# Patient Record
Sex: Male | Born: 1980 | Race: Black or African American | Hispanic: No | Marital: Single | Smoking: Current every day smoker
Health system: Southern US, Community
[De-identification: ages and names within clinical notes are randomized; demographics above are authoritative.]

---

## 2019-05-23 ENCOUNTER — Other Ambulatory Visit: Payer: Self-pay

## 2019-05-23 ENCOUNTER — Ambulatory Visit (HOSPITAL_COMMUNITY)
Admission: EM | Admit: 2019-05-23 | Discharge: 2019-05-23 | Disposition: A | Discharge status: Home / Self Care | Payer: Self-pay | Attending: Family Medicine | Admitting: Family Medicine

## 2019-05-23 ENCOUNTER — Encounter (HOSPITAL_COMMUNITY): Payer: Self-pay

## 2019-05-23 DIAGNOSIS — M65831 Other synovitis and tenosynovitis, right forearm: Secondary | ICD-10-CM

## 2019-05-23 MED ORDER — HYDROCODONE-ACETAMINOPHEN 5-325 MG PO TABS: 1.0000 | ORAL_TABLET | ORAL | 0 refills | Status: DC | PRN

## 2019-05-23 MED ORDER — METHYLPREDNISOLONE 4 MG PO TBPK: ORAL_TABLET | ORAL | 0 refills | Status: DC

## 2019-05-23 NOTE — Discharge Instructions (Addendum)
Use ice for 20 minutes every couple of hours Wear brace as long as versus painful Limit heavy use of hand Take Medrol pack as directed.  Take all of day 1 today Take pain medication as needed.  Do not drive on pain medication Expect improvement over few days.  See orthopedist if you fail to improve

## 2019-05-23 NOTE — ED Triage Notes (Signed)
Patient presents to Urgent Care with complaints of a painful "knot" on the top of his right hand since about 5 days ago. Patient reports no known injury, woke up with it.

## 2019-05-23 NOTE — ED Provider Notes (Signed)
[facility]    CSN: [id number] Arrival date & time: 05/23/19  1049      History   Chief Complaint Chief Complaint  Patient presents with  . Arm Pain    HPI Jason Acosta is a 38 y.o. male.   HPI  Patient has swelling and pain on the top of his right hand.  Is been present for 5 days.  It is worse when he uses it better with rest.  He does not recall any trauma or accident.  He works as a Building control surveyor.  He uses his hands extensively in his job.  Does not recall any specific repetitive motion.  He does a lot of grabbing and grasping.  He is right-handed.  No numbness or weakness.  History reviewed. No pertinent past medical history.  There are no active problems to display for this patient.   History reviewed. No pertinent surgical history.     Home Medications    Prior to Admission medications   Medication Sig Start Date End Date Taking? Authorizing Provider  HYDROcodone-acetaminophen (NORCO/VICODIN) 5-325 MG tablet Take 1-2 tablets by mouth every 4 (four) hours as needed. 05/23/19   Raylene Everts, MD  methylPREDNISolone (MEDROL DOSEPAK) 4 MG TBPK tablet TAD 05/23/19   Raylene Everts, MD    Family History Family History  Problem Relation Age of Onset  . Diabetes Mother   . Healthy Father     Social History Social History   Tobacco Use  . Smoking status: Current Every Day Smoker    Packs/day: 0.50    Types: Cigarettes, Cigars  . Smokeless tobacco: Never Used  . Tobacco comment: black and milds  Substance Use Topics  . Alcohol use: Not on file  . Drug use: Not on file     Allergies   Patient has no known allergies.   Review of Systems Review of Systems  Constitutional: Negative for chills and fever.  HENT: Negative for ear pain and sore throat.   Eyes: Negative for pain and visual disturbance.  Respiratory: Negative for cough and shortness of breath.   Cardiovascular: Negative for chest pain and palpitations.  Gastrointestinal:  Negative for abdominal pain and vomiting.  Genitourinary: Negative for dysuria and hematuria.  Musculoskeletal: Positive for arthralgias. Negative for back pain.  Skin: Negative for color change and rash.  Neurological: Negative for seizures and syncope.  All other systems reviewed and are negative.    Physical Exam Triage Vital Signs ED Triage Vitals  Enc Vitals Group     BP 05/23/19 1134 122/70     Pulse Rate 05/23/19 1134 67     Resp 05/23/19 1134 16     Temp 05/23/19 1134 98.8 F (37.1 C)     Temp Source 05/23/19 1134 Oral     SpO2 05/23/19 1134 100 %     Weight --      Height --      Head Circumference --      Peak Flow --      Pain Score 05/23/19 1132 10     Pain Loc --      Pain Edu? --      Excl. in [place]? --    No data found.  Updated Vital Signs BP 122/70 (BP Location: Left Arm)   Pulse 67   Temp 98.8 F (37.1 C) (Oral)   Resp 16   SpO2 100%     Physical Exam Constitutional:      General: He is not  in acute distress.    Appearance: He is well-developed.  HENT:     Head: Normocephalic and atraumatic.  Eyes:     Conjunctiva/sclera: Conjunctivae normal.     Pupils: Pupils are equal, round, and reactive to light.  Neck:     Musculoskeletal: Normal range of motion.  Cardiovascular:     Rate and Rhythm: Normal rate.  Pulmonary:     Effort: Pulmonary effort is normal. No respiratory distress.  Abdominal:     General: There is no distension.     Palpations: Abdomen is soft.  Musculoskeletal: Normal range of motion.     Comments: Right hand has swelling over the dorsum central carpal region.  Is over the extensor tendons of the hand.  Pain with flexion and extension of the fingers.  Pain with range of motion of the hand.  Patient especially has pain with flexion of the fingers and wrist concomitantly  Skin:    General: Skin is warm and dry.  Neurological:     General: No focal deficit present.     Mental Status: He is alert.     Sensory: No sensory  deficit.     Motor: No weakness.     Coordination: Coordination normal.     Deep Tendon Reflexes: Reflexes normal.  Psychiatric:        Mood and Affect: Mood normal.        Behavior: Behavior normal.      UC Treatments / Results  Labs (all labs ordered are listed, but only abnormal results are displayed) Labs Reviewed - No data to display  EKG   Radiology No results found.  Procedures Procedures (including critical care time)  Medications Ordered in UC Medications - No data to display  Initial Impression / Assessment and Plan / UC Course  I have reviewed the triage vital signs and the nursing notes.  Pertinent labs & imaging results that were available during my care of the patient were reviewed by me and considered in my medical decision making (see chart for details).     Extensor tenosynovitis is discussed with patient. Final Clinical Impressions(s) / UC Diagnoses   Final diagnoses:  Extensor tenosynovitis of wrist, right     Discharge Instructions     Use ice for 20 minutes every couple of hours Wear brace as long as versus painful Limit heavy use of hand Take Medrol pack as directed.  Take all of day 1 today Take pain medication as needed.  Do not drive on pain medication Expect improvement over few days.  See orthopedist if you fail to improve   ED Prescriptions    Medication Sig Dispense Auth. Provider   methylPREDNISolone (MEDROL DOSEPAK) 4 MG TBPK tablet TAD 21 tablet Eustace Moore, MD   HYDROcodone-acetaminophen (NORCO/VICODIN) 5-325 MG tablet Take 1-2 tablets by mouth every 4 (four) hours as needed. 10 tablet Eustace Moore, MD     I have reviewed the PDMP during this encounter.   Eustace Moore, MD 05/23/19 [phone]

## 2019-06-15 ENCOUNTER — Other Ambulatory Visit: Payer: Self-pay

## 2019-06-15 ENCOUNTER — Encounter (HOSPITAL_COMMUNITY): Payer: Self-pay

## 2019-06-15 ENCOUNTER — Ambulatory Visit (HOSPITAL_COMMUNITY)
Admission: EM | Admit: 2019-06-15 | Discharge: 2019-06-15 | Disposition: A | Discharge status: Home / Self Care | Payer: Self-pay | Attending: Urgent Care | Admitting: Urgent Care

## 2019-06-15 DIAGNOSIS — M25431 Effusion, right wrist: Secondary | ICD-10-CM

## 2019-06-15 DIAGNOSIS — M25531 Pain in right wrist: Secondary | ICD-10-CM

## 2019-06-15 DIAGNOSIS — M65831 Other synovitis and tenosynovitis, right forearm: Secondary | ICD-10-CM

## 2019-06-15 MED ORDER — PREDNISONE 20 MG PO TABS: ORAL_TABLET | ORAL | 0 refills | Status: DC

## 2019-06-15 MED ORDER — NAPROXEN 500 MG PO TABS: 500.0000 mg | ORAL_TABLET | Freq: Two times a day (BID) | ORAL | 0 refills | Status: DC

## 2019-06-15 NOTE — ED Provider Notes (Signed)
  MRN: [id number] DOB: 03-19-1981  Subjective:   Jason Acosta is a 38 y.o. male presenting for 1 month history of persistent right wrist pain and swelling.  Patient was last seen about a month ago, was given a wrist brace.  Unfortunately, patient did not pick up any medications prescribed to him due to concern of taking pain medicines.  This includes him not taking the Medrol pack.  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  Denies fall, trauma, redness, history of gout, history of ganglion cyst.  He is not currently taking any medications and has no known food or drug allergies.  Denies past medical and surgical history.   Family History  Problem Relation Age of Onset  . Diabetes Mother   . Healthy Father     Social History   Tobacco Use  . Smoking status: Current Every Day Smoker    Packs/day: 0.50    Types: Cigarettes, Cigars  . Smokeless tobacco: Never Used  . Tobacco comment: black and milds  Substance Use Topics  . Alcohol use: Not on file  . Drug use: Not on file    ROS   Objective:   Vitals: BP (!) 125/57 (BP Location: Right Arm)   Pulse 74   Temp 98.8 F (37.1 C) (Oral)   Resp 16   SpO2 100%   Physical Exam Constitutional:      Appearance: Normal appearance. He is well-developed and normal weight.  HENT:     Head: Normocephalic and atraumatic.     Right Ear: External ear normal.     Left Ear: External ear normal.     Nose: Nose normal.     Mouth/Throat:     Pharynx: Oropharynx is clear.  Eyes:     Extraocular Movements: Extraocular movements intact.     Pupils: Pupils are equal, round, and reactive to light.  Cardiovascular:     Rate and Rhythm: Normal rate.  Pulmonary:     Effort: Pulmonary effort is normal.  Musculoskeletal:     Right wrist: He exhibits decreased range of motion (extension), tenderness (dorsal aspect of wrist as depicted) and swelling. He exhibits no bony tenderness, no  effusion, no crepitus, no deformity and no laceration.       Arms:  Neurological:     Mental Status: He is alert and oriented to person, place, and time.  Psychiatric:        Mood and Affect: Mood normal.        Behavior: Behavior normal.     Assessment and Plan :   1. Extensor tenosynovitis of right wrist   2. Pain and swelling of right wrist     We will manage conservatively for extensor tenosynovitis due to the nature of his work with prednisone course followed by Naprosyn, rest and modification of physical activity with work restrictions.  Anticipatory guidance provided.  Recommended patient look for PCP through [facility] in case his employer requires FMLA.  Also provided patient with information to [facility] in [place] in case patient needs further work-up including imaging such as an ultrasound or MRI.  Offered patient an x-ray but he refuses.  Counseled patient on potential for adverse effects with medications prescribed/recommended today, ER and return-to-clinic precautions discussed, patient verbalized understanding.    Jaynee Eagles, [place] 06/15/19 [phone]

## 2019-06-15 NOTE — ED Triage Notes (Signed)
Pt presents to UC w/ c/o right wrist pain x1 month. Pt states he was seen here 1 month ago and given a brace. Pt states pain has not gotten better since last visit. Pt has small swollen painful area on right wrist.

## 2019-06-15 NOTE — Discharge Instructions (Signed)
Wear the Ace wrap while at work. If needed, wear it at bedtime. Going forward, after each shift, apply ice to your right wrist for 20 minutes.

## 2019-06-21 ENCOUNTER — Other Ambulatory Visit: Payer: Self-pay

## 2019-06-21 ENCOUNTER — Emergency Department (HOSPITAL_COMMUNITY)
Admission: EM | Admit: 2019-06-21 | Discharge: 2019-06-21 | Disposition: A | Discharge status: Home / Self Care | Payer: Self-pay | Attending: Emergency Medicine | Admitting: Emergency Medicine

## 2019-06-21 ENCOUNTER — Encounter (HOSPITAL_COMMUNITY): Payer: Self-pay

## 2019-06-21 ENCOUNTER — Emergency Department (HOSPITAL_COMMUNITY): Payer: Self-pay

## 2019-06-21 DIAGNOSIS — F17210 Nicotine dependence, cigarettes, uncomplicated: Secondary | ICD-10-CM | POA: Insufficient documentation

## 2019-06-21 DIAGNOSIS — R1032 Left lower quadrant pain: Secondary | ICD-10-CM | POA: Insufficient documentation

## 2019-06-21 DIAGNOSIS — Z5320 Procedure and treatment not carried out because of patient's decision for unspecified reasons: Secondary | ICD-10-CM | POA: Insufficient documentation

## 2019-06-21 DIAGNOSIS — Z5321 Procedure and treatment not carried out due to patient leaving prior to being seen by health care provider: Secondary | ICD-10-CM

## 2019-06-21 DIAGNOSIS — M79675 Pain in left toe(s): Secondary | ICD-10-CM | POA: Insufficient documentation

## 2019-06-21 MED ORDER — ACETAMINOPHEN 325 MG PO TABS
650.0000 mg | ORAL_TABLET | Freq: Once | ORAL | Status: AC
  Administered 2019-06-21: 650 mg via ORAL
  Filled 2019-06-21: qty 2

## 2019-06-21 NOTE — ED Notes (Signed)
Pt seen walking out of ED without any problems before being discharged

## 2019-06-21 NOTE — ED Provider Notes (Signed)
[facility] Provider Note   CSN: [id number] Arrival date & time: 06/21/19  1556     History   Chief Complaint Chief Complaint  Patient presents with   Toe Pain   Groin Pain    HPI Jason Acosta is a 38 y.o. male with no known past medical history presenting to emergency department today with chief complaint of left great toe pain and groin pain.  He endorses toe pain x 5 days. He denies any injury. Pain has progressively worsened since onset. He describes pain as sharp. Pain does not radiate. He rates pain 7/10 in severity. Pain is worse with movement. He took aspirin with minimal pain relief.  Also noticed knot in left groin this morning. He states when getting dressed this morning he felt pain in his left thigh/groin area. He describes pain as intermittent aching. He rates pain 3/10 in severity. No radiating or modifying factors. No history of similar pain.  Pain denies history of gout or diabetes. He denies fever, chills, chest pain, rash, numbness, weakness, nausea, vomiting, urinary symptoms, penile discharge. He is not concerned for STIs. History reviewed. No pertinent past medical history.  There are no active problems to display for this patient.   History reviewed. No pertinent surgical history.      Home Medications    Prior to Admission medications   Medication Sig Start Date End Date Taking? Authorizing Provider  naproxen (NAPROSYN) 500 MG tablet Take 1 tablet (500 mg total) by mouth 2 (two) times daily. 06/15/19   Jaynee Eagles, PA-C  predniSONE (DELTASONE) 20 MG tablet Take 2 tablets daily with breakfast. 06/15/19   Jaynee Eagles, PA-C    Family History Family History  Problem Relation Age of Onset   Diabetes Mother    Healthy Father     Social History Social History   Tobacco Use   Smoking status: Current Every Day Smoker    Packs/day: 0.50    Types: Cigarettes, Cigars   Smokeless tobacco: Never Used    Tobacco comment: black and milds  Substance Use Topics   Alcohol use: Not on file   Drug use: Not on file     Allergies   Patient has no known allergies.   Review of Systems Review of Systems  Constitutional: Negative for chills, fatigue, fever and unexpected weight change.  Gastrointestinal: Negative for abdominal pain, nausea and vomiting.  Genitourinary: Negative for discharge, dysuria, frequency, genital sores, penile pain, penile swelling, scrotal swelling and testicular pain.  Musculoskeletal: Positive for arthralgias. Negative for back pain, gait problem, myalgias and neck pain.  Skin: Negative for rash and wound.     Physical Exam Updated Vital Signs BP 124/70    Pulse 77    Temp 98.8 F (37.1 C)    Resp 16    SpO2 100%   Physical Exam Vitals signs and nursing note reviewed.  Constitutional:      Appearance: He is well-developed. He is not ill-appearing or toxic-appearing.  HENT:     Head: Normocephalic and atraumatic.     Nose: Nose normal.  Eyes:     General: No scleral icterus.       Right eye: No discharge.        Left eye: No discharge.     Conjunctiva/sclera: Conjunctivae normal.  Neck:     Musculoskeletal: Normal range of motion.     Vascular: No JVD.  Cardiovascular:     Rate and Rhythm: Normal rate and regular  rhythm.     Pulses: Normal pulses.     Heart sounds: Normal heart sounds.  Pulmonary:     Effort: Pulmonary effort is normal.     Breath sounds: Normal breath sounds.  Abdominal:     General: There is no distension.  Genitourinary:      Comments: Tenderness to palpation as depicted in image above. No gross abscess, no induration or fluctuance. No erythema or edema noted. No wounds seen.  Chaperone [employer] [facility] [employer] RN present for exam. No discharge or urethritis noted. No signs of sores or lesions or erythema on the penis or testicles. The penis and testicles are nontender. No testicular masses or swelling. No scrotal swelling. No signs of any  inguinal hernias. Cremaster reflex present bilaterally.   Musculoskeletal: Normal range of motion.       Feet:     Comments: Full ROM of left ankle. Pt ambulates with mildly antalgic gait  Feet:     Right foot:     Skin integrity: Dry skin present.     Toenail Condition: Right toenails are normal.     Left foot:     Skin integrity: Dry skin present.     Toenail Condition: Left toenails are normal.     Comments: Tenderness to palpation as depicted in image above. No open wound. Warm to palpation without overlying erythema. No induration or fluctuance. No gross abscess or signs of infection.   Able to wiggle all toes. Brisk cap refill. DP pulse 2+ Skin:    General: Skin is warm and dry.  Neurological:     Mental Status: He is oriented to person, place, and time.     GCS: GCS eye subscore is 4. GCS verbal subscore is 5. GCS motor subscore is 6.     Comments: Fluent speech, no facial droop.  Psychiatric:        Behavior: Behavior normal.      ED Treatments / Results  Labs (all labs ordered are listed, but only abnormal results are displayed) Labs Reviewed - No data to display  EKG None  Radiology Dg Toe Great Left  Result Date: 06/21/2019 CLINICAL DATA:  Left great toe pain for 5 days, pain worsened medially EXAM: LEFT GREAT TOE COMPARISON:  None. FINDINGS: There is some mild soft tissue thickening along the medial aspect of the first interphalangeal and metacarpophalangeal joints. No significant underlying arthrosis, erosion or destructive changes seen. Bone mineralization is appropriate. No acute fracture or traumatic malalignment. IMPRESSION: Mild nonspecific soft tissue thickening along the medial aspect of the first interphalangeal and metacarpophalangeal joints without underlying osseous abnormality or focal erosion. Electronically Signed   By: Kreg ShropshirePrice  DeHay M.D.   On: 06/21/2019 17:03    Procedures Procedures (including critical care time)  Medications Ordered in  ED Medications  acetaminophen (TYLENOL) tablet 650 mg (650 mg Oral Given 06/21/19 2059)     Initial Impression / Assessment and Plan / ED Course  I have reviewed the triage vital signs and the nursing notes.  Pertinent labs & imaging results that were available during my care of the patient were reviewed by me and considered in my medical decision making (see chart for details).  Patient seen and examined. Patient nontoxic appearing, in no apparent distress. He is afebrile, vitals stable. He presents with toe pain. Xray ordered in triage. I viewed image which shows mild nonspecific soft tissue thickening along the medial aspect of the first interphalangeal and metacarpophalangeal joints without underlying osseous abnormality or focal erosion.  No signs of osteomyelitis. On exam he has no gross abscess or wound. No signs of ingrown toenail. Not overly suggestive of gout, he is pescatarian and does not drink alcohol.   He also has the pain his left groin. No inguinal lymphadenopathy felt.  No signs of inguinal hernia. Again no abscess noted. Pt not concerned for STIs. No GU finding on exam. Unlikely gonorrhea. No rash or findings to suggest syphilis.   Patient eloped from emergency department. I was informed of his departure by nursing staff after he left the department. RN comments he ambulated out of ED with steady gait, no signs of distress.  Portions of this note were generated with Scientist, clinical (histocompatibility and immunogenetics). Dictation errors may occur despite best attempts at proofreading.    Final Clinical Impressions(s) / ED Diagnoses   Final diagnoses:  Eloped from emergency department    ED Discharge Orders    None       Sherene Sires, PA-C 06/22/19 0043    Milagros Loll, MD 06/30/19 2055

## 2019-06-21 NOTE — ED Triage Notes (Signed)
Pt reports left big toe pain for 5 days, and also states he noticed a knot in his left groin area this morning. Pt ambulatory

## 2019-06-22 ENCOUNTER — Emergency Department (HOSPITAL_COMMUNITY)
Admission: EM | Admit: 2019-06-22 | Discharge: 2019-06-23 | Disposition: A | Discharge status: Home / Self Care | Payer: Self-pay | Attending: Emergency Medicine | Admitting: Emergency Medicine

## 2019-06-22 ENCOUNTER — Encounter (HOSPITAL_COMMUNITY): Payer: Self-pay | Admitting: *Deleted

## 2019-06-22 ENCOUNTER — Other Ambulatory Visit: Payer: Self-pay

## 2019-06-22 DIAGNOSIS — M79672 Pain in left foot: Secondary | ICD-10-CM | POA: Insufficient documentation

## 2019-06-22 DIAGNOSIS — R2242 Localized swelling, mass and lump, left lower limb: Secondary | ICD-10-CM | POA: Insufficient documentation

## 2019-06-22 DIAGNOSIS — F17210 Nicotine dependence, cigarettes, uncomplicated: Secondary | ICD-10-CM | POA: Insufficient documentation

## 2019-06-22 DIAGNOSIS — R1032 Left lower quadrant pain: Secondary | ICD-10-CM | POA: Insufficient documentation

## 2019-06-22 DIAGNOSIS — M79675 Pain in left toe(s): Secondary | ICD-10-CM

## 2019-06-22 MED ORDER — DOXYCYCLINE HYCLATE 100 MG PO CAPS: 100.0000 mg | ORAL_CAPSULE | Freq: Two times a day (BID) | ORAL | 0 refills | Status: AC

## 2019-06-22 MED ORDER — OXYCODONE-ACETAMINOPHEN 5-325 MG PO TABS: 1.0000 | ORAL_TABLET | Freq: Three times a day (TID) | ORAL | 0 refills | Status: DC | PRN

## 2019-06-22 MED ORDER — DOXYCYCLINE HYCLATE 100 MG PO TABS
100.0000 mg | ORAL_TABLET | Freq: Once | ORAL | Status: AC
  Administered 2019-06-23: 100 mg via ORAL
  Filled 2019-06-22: qty 1

## 2019-06-22 MED ORDER — IBUPROFEN 400 MG PO TABS
600.0000 mg | ORAL_TABLET | Freq: Once | ORAL | Status: AC
  Administered 2019-06-23: 600 mg via ORAL
  Filled 2019-06-22: qty 1

## 2019-06-22 NOTE — ED Triage Notes (Signed)
The pt is c/o pain in his lt entire leg  For 3-4 days  He was here yesterday  But got tired of waiting and left   No known injury

## 2019-06-22 NOTE — ED Provider Notes (Signed)
[facility] EMERGENCY DEPARTMENT Provider Note   CSN: [id number] Arrival date & time: 06/22/19  2110     History   Chief Complaint Chief Complaint  Patient presents with  . Leg Pain    HPI Jason Acosta is a 38 y.o. male with no significant past medical history who presents to the emergency department with a chief complaint of left toe pain.  The patient reports left toe pain that has been constant and worsening since onset for the last 3-4 days.  He reports that the pain is now radiating up the foot and into his leg.  Pain is worse with nonweightbearing and worse with weightbearing and walking.  No known injury to the toe or foot.  No recent surgery.  He took 1000 g of Tylenol earlier this evening with no improvement in his symptoms.  He also notes that he developed left-sided groin pain 3-4 days ago around when the toe pain began.  He reports that the area is erythematous and appears swollen.  No areas of drainage.  No red streaking.  No fevers or chills.  He denies difficulty urinating, penile or testicular pain or swelling, nausea, vomiting, diarrhea, shortness of breath, chest pain, numbness, or weakness.  No left hip or knee pain.  Of note, the patient was also seen by urgent care around a month ago for swelling to the dorsum of the right hand and wrist.  He was discharged with pain medication and a prednisone Dosepak with no improvement in his symptoms.  He followed up with urgent care again last week and was referred to orthopedics, but has not made a follow-up appointment because he does not currently have medical insurance.  The patient reports that he lives in [place], but spends most of his time in [place] because his job is located here.  He does not have a primary care provider.     The history is provided by the patient. No language interpreter was used.    History reviewed. No pertinent past medical history.  There are no active problems to  display for this patient.   History reviewed. No pertinent surgical history.      Home Medications    Prior to Admission medications   Medication Sig Start Date End Date Taking? Authorizing Provider  doxycycline (VIBRAMYCIN) 100 MG capsule Take 1 capsule (100 mg total) by mouth 2 (two) times daily for 7 days. 06/22/19 06/29/19  Jevaughn Degollado A, PA-C  naproxen (NAPROSYN) 500 MG tablet Take 1 tablet (500 mg total) by mouth 2 (two) times daily. 06/15/19   Jaynee Eagles, PA-C  oxyCODONE-acetaminophen (PERCOCET/ROXICET) 5-325 MG tablet Take 1 tablet by mouth every 8 (eight) hours as needed for severe pain. 06/22/19   Krysta Bloomfield A, PA-C  predniSONE (DELTASONE) 20 MG tablet Take 2 tablets daily with breakfast. 06/15/19   Jaynee Eagles, PA-C    Family History Family History  Problem Relation Age of Onset  . Diabetes Mother   . Healthy Father     Social History Social History   Tobacco Use  . Smoking status: Current Every Day Smoker    Packs/day: 0.50    Types: Cigarettes, Cigars  . Smokeless tobacco: Never Used  . Tobacco comment: black and milds  Substance Use Topics  . Alcohol use: Never    Frequency: Never  . Drug use: Never     Allergies   Patient has no known allergies.   Review of Systems Review of Systems  Constitutional: Negative  for appetite change, chills and fever.  HENT: Negative for congestion and sore throat.   Respiratory: Negative for shortness of breath.   Cardiovascular: Negative for chest pain.  Gastrointestinal: Negative for abdominal pain, diarrhea, nausea and vomiting.  Genitourinary: Negative for discharge, dysuria, flank pain, frequency, genital sores, penile pain, penile swelling, scrotal swelling, testicular pain and urgency.  Musculoskeletal: Positive for arthralgias, gait problem and myalgias. Negative for back pain, joint swelling, neck pain and neck stiffness.  Skin: Positive for color change. Negative for rash.  Allergic/Immunologic:  Negative for immunocompromised state.  Neurological: Negative for syncope, weakness, numbness and headaches.  Psychiatric/Behavioral: Negative for confusion.    Physical Exam Updated Vital Signs BP 126/74 (BP Location: Right Arm)   Pulse (!) 50   Temp 97.7 F (36.5 C) (Oral)   Resp 16   Ht 5\' 8"  (1.727 m)   Wt 63.5 kg   SpO2 99%   BMI 21.29 kg/m   Physical Exam Vitals signs and nursing note reviewed.  Constitutional:      General: He is not in acute distress.    Appearance: He is well-developed. He is not ill-appearing, toxic-appearing or diaphoretic.  HENT:     Head: Normocephalic.  Eyes:     Conjunctiva/sclera: Conjunctivae normal.  Neck:     Musculoskeletal: Neck supple.  Cardiovascular:     Rate and Rhythm: Normal rate and regular rhythm.     Heart sounds: No murmur.  Pulmonary:     Effort: Pulmonary effort is normal. No respiratory distress.     Breath sounds: No stridor. No wheezing, rhonchi or rales.  Chest:     Chest wall: No tenderness.  Abdominal:     General: There is no distension.     Palpations: Abdomen is soft. There is no mass.     Tenderness: There is no abdominal tenderness. There is no right CVA tenderness, left CVA tenderness, guarding or rebound.     Hernia: No hernia is present.  Musculoskeletal:     Right lower leg: No edema.     Left lower leg: No edema.     Comments: Area of redness noted in the femoral region of the left side with a localized area of tenderness.  There appears to be a large, swollen, circular located in this area.  No fluctuance or induration.  No other palpable lymph nodes in the groin bilaterally.  No other swelling or redness located throughout the thigh or in the groin.  Chaperoned exam and no testicular pain or swelling.  Left great toe is diffusely swollen and diffusely tender to palpation.  At least 1/3 larger in size when compared to the right great toe. Unable to flex or extend at the IP joint of the left great toe  secondary to pain.  No focal tenderness to palpation over the left MTP joint.  There is no redness or warmth throughout the entire toe.  The pad of the left great toe is soft.  There is no evidence of purulence around the lateral nail folds or epionychium.   Swelling is noted to the dorsum of the right wrist.  Minimally tender to palpation.  Full active and passive range of motion of the right wrist.  Neurovascularly intact to the bilateral upper and lower extremities.  Skin:    General: Skin is warm and dry.  Neurological:     Mental Status: He is alert.  Psychiatric:        Behavior: Behavior normal.  ED Treatments / Results  Labs (all labs ordered are listed, but only abnormal results are displayed) Labs Reviewed - No data to display  EKG None  Radiology Dg Toe Great Left  Result Date: 06/21/2019 CLINICAL DATA:  Left great toe pain for 5 days, pain worsened medially EXAM: LEFT GREAT TOE COMPARISON:  None. FINDINGS: There is some mild soft tissue thickening along the medial aspect of the first interphalangeal and metacarpophalangeal joints. No significant underlying arthrosis, erosion or destructive changes seen. Bone mineralization is appropriate. No acute fracture or traumatic malalignment. IMPRESSION: Mild nonspecific soft tissue thickening along the medial aspect of the first interphalangeal and metacarpophalangeal joints without underlying osseous abnormality or focal erosion. Electronically Signed   By: Kreg ShropshirePrice  DeHay M.D.   On: 06/21/2019 17:03    Procedures Ultrasound ED Soft Tissue  Date/Time: 06/22/2019 11:58 PM Performed by: Barkley BoardsMcDonald, Devine Klingel A, PA-C Authorized by: Barkley BoardsMcDonald, Akaisha Truman A, PA-C   Procedure details:    Indications: limb pain     Transverse view:  Visualized   Longitudinal view:  Visualized   Images: archived   Location:    Location: lower extremity     Side:  Left Findings:     no abscess present    no cellulitis present Comments:     There is a large  circular area concerning for a swollen lymph node.  This does not look consistent with an abscess and there is no evidence of cellulitis.  However, this area is located adjacent to the left femoral vein, which was not able to fully be compressed on bedside ultrasound, but there does appear to be flow.  Exam is limited secondary to tenderness in this area.   (including critical care time)  Medications Ordered in ED Medications  ibuprofen (ADVIL) tablet 600 mg (600 mg Oral Given 06/23/19 0016)  doxycycline (VIBRA-TABS) tablet 100 mg (100 mg Oral Given 06/23/19 0016)     Initial Impression / Assessment and Plan / ED Course  I have reviewed the triage vital signs and the nursing notes.  Pertinent labs & imaging results that were available during my care of the patient were reviewed by me and considered in my medical decision making (see chart for details).  Clinical Course as of Jun 22 652  Wed Jun 22, 2019  [id number] 38 year old male with some great toe swelling pain over the last few days now also with pain mostly in his inner thigh.  There is a little bit of overlying erythema and he is got some tender nodules.  We took a look with the ultrasound and I do not see any gross evidence of DVT but he has so much tenderness in that area that is hard to fully compress some.  There was some augmentation to compression of his calf so I think DVT is less likely.  Formal duplex not available at this time so we will treat him for possible infection in the foot with a groin adenitis and have him come back tomorrow to get a formal ultrasound.   [MB]    Clinical Course User Index [MB] Terrilee FilesButler, Michael C, MD       38 year old male with no significant past medical history presenting with left groin pain and left great toe pain that began approximately 3 to 4 days ago.  No constitutional symptoms.  He is hemodynamically stable on arrival to the ER.  The patient was seen and evaluated alongside Dr. Charm BargesButler,  attending physician.  Patient's left great toe is edematous  and diffusely painful to palpation.  He has no tenderness over the MTP joint.  There is no redness or warmth.  X-ray from previous ER visit was reviewed, which demonstrated mild nonspecific soft tissue thickening along the medial aspect of the first interphalangeal and metacarpophalangeal joints without underlying osseous abnormality or focal erosion.  This digit is swollen to at least at least a third of the size when compared to the right great toe.   In regards to his groin pain, there is no obvious abscess or cellulitis on bedside ultrasound.  Also considered femoral hernia, but this is less likely. Bedside ultrasound appears to show an enlarged lymph node, but he is very tender in this area and there is a portion of the femoral vein that is difficult to compress.  This could represent a acutely enlarged lymph node secondary to the possible infection in his left great toe.  However, could also represent a proximal DVT.  He is having no GU complaints at this time.  Low suspicion for scrotal abscess, epididymitis, testicular torsion, or other GU pathology.  Will schedule the patient for an outpatient vascular venous duplex study.  We will also start the patient on doxycycline cellulitic versus indolent infection of the left great toe.  He will be given a referral to vascular study.  The patient is also not established with primary care and additionally had a tender, painful mask appear on the dorsum of the right hand and wrist approximately 1 to 1-1/2 months ago that has not resolved and he is unable to fully flex and extend the wrist.  Care management was consulted to assist the patient with getting established with primary care.  I will also send the patient home with a short course of pain medication.  Final Clinical Impressions(s) / ED Diagnoses   Final diagnoses:  Pain and swelling of toe of left foot  Groin pain, left    ED Discharge  Orders         Ordered    LE VENOUS     06/22/19 2327    doxycycline (VIBRAMYCIN) 100 MG capsule  2 times daily     06/22/19 2329    oxyCODONE-acetaminophen (PERCOCET/ROXICET) 5-325 MG tablet  Every 8 hours PRN     06/22/19 2329           Tinzlee Craker, Pedro Earls A, PA-C 06/23/19 0653    Terrilee Files, MD 06/23/19 [phone]

## 2019-06-22 NOTE — Discharge Instructions (Signed)
Thank you for allowing me to care for you today in the Emergency Department.   IMPORTANT PATIENT INSTRUCTIONS:   You have been scheduled for an Outpatient Vascular Study at [facility].    If tomorrow is a Saturday or Sunday, please go to the [facility] Emergency Department registration desk at 8 AM tomorrow morning and tell them you are therefore a vascular study.  If tomorrow is a weekday (Monday - Friday), please go to the [facility] Admitting Department at 8 AM and tell them you are therefore a vascular study   For the pain and swelling in your toe, your first dose of antibiotics, doxycycline, was given tonight in the ER.  Starting tomorrow morning, take 1 tablet of doxycycline 2 times daily for the next week.  Take 650 mg of Tylenol or 600 mg of ibuprofen with food every 6 hours for pain.  You can alternate between these 2 medications every 3 hours if your pain returns.  For instance, you can take Tylenol at noon, followed by a dose of ibuprofen at 3, followed by second dose of Tylenol and 6.  For severe, uncontrollable pain, you can take 1 tablet of Percocet.  Each tablet of Percocet contains 325 mg of Tylenol.  Do not take more than 4000 mg of Tylenol in a 24-hour period.  Percocet is a narcotic.  It can be addicting.  You should not work or drive or take this medication with other sedating substances, such as alcohol.  You can also apply an ice pack to your toe for 15 to 20 minutes as frequently as needed to help with pain and swelling.  Try to elevate your left leg so that your toes are at or above the level of your nose.  Call to schedule a follow-up appointment with Dr. Daylene Katayama with podiatry to follow-up within the next week regarding your left toe pain and swelling, especially if your symptoms do not significantly improve on antibiotics.  Please also see the information above from [facility] health and wellness regarding getting set up with a primary care provider for follow-up if  you continue to have other health concerns.  You should return to the emergency department if you develop chest pain, shortness of breath, fevers, red streaking up the foot or the toe, if you start to have thick, mucus-like drainage from the toe, if you become unable to urinate, or develop new weakness or numbness in your leg or if it becomes cold to the touch, or other new, concerning symptoms.

## 2019-06-23 ENCOUNTER — Other Ambulatory Visit: Payer: Self-pay

## 2019-06-23 ENCOUNTER — Ambulatory Visit (HOSPITAL_COMMUNITY)
Admission: RE | Admit: 2019-06-23 | Discharge: 2019-06-23 | Disposition: A | Discharge status: Home / Self Care | Payer: Self-pay | Source: Ambulatory Visit | Attending: Emergency Medicine | Admitting: Emergency Medicine

## 2019-06-23 DIAGNOSIS — M7989 Other specified soft tissue disorders: Secondary | ICD-10-CM

## 2019-06-23 DIAGNOSIS — L089 Local infection of the skin and subcutaneous tissue, unspecified: Secondary | ICD-10-CM | POA: Insufficient documentation

## 2019-06-23 DIAGNOSIS — R52 Pain, unspecified: Secondary | ICD-10-CM

## 2019-06-23 DIAGNOSIS — R1909 Other intra-abdominal and pelvic swelling, mass and lump: Secondary | ICD-10-CM | POA: Insufficient documentation

## 2019-06-23 NOTE — Progress Notes (Signed)
LLE venous duplex       has been completed. Preliminary results can be found under CV proc through chart review. June Leap, BS, RDMS, RVT    Patient was to misinformed for the arrival time for this test. The correct time of 11am is on the discharge papers.

## 2019-07-14 ENCOUNTER — Other Ambulatory Visit: Payer: Self-pay

## 2019-07-14 ENCOUNTER — Emergency Department (HOSPITAL_COMMUNITY)
Admission: EM | Admit: 2019-07-14 | Discharge: 2019-07-14 | Disposition: A | Discharge status: Home / Self Care | Payer: Self-pay | Attending: Emergency Medicine | Admitting: Emergency Medicine

## 2019-07-14 ENCOUNTER — Encounter (HOSPITAL_COMMUNITY): Payer: Self-pay | Admitting: Emergency Medicine

## 2019-07-14 DIAGNOSIS — M25531 Pain in right wrist: Secondary | ICD-10-CM | POA: Insufficient documentation

## 2019-07-14 DIAGNOSIS — S90222D Contusion of left lesser toe(s) with damage to nail, subsequent encounter: Secondary | ICD-10-CM | POA: Insufficient documentation

## 2019-07-14 DIAGNOSIS — X58XXXD Exposure to other specified factors, subsequent encounter: Secondary | ICD-10-CM | POA: Insufficient documentation

## 2019-07-14 DIAGNOSIS — F17210 Nicotine dependence, cigarettes, uncomplicated: Secondary | ICD-10-CM | POA: Insufficient documentation

## 2019-07-14 MED ORDER — NAPROXEN 375 MG PO TABS: 375.0000 mg | ORAL_TABLET | Freq: Two times a day (BID) | ORAL | 0 refills | Status: DC

## 2019-07-14 NOTE — ED Notes (Signed)
Patient verbalizes understanding of discharge instructions. Opportunity for questioning and answers were provided. Armband removed by staff, pt discharged from ED.  

## 2019-07-14 NOTE — ED Provider Notes (Signed)
[facility] EMERGENCY DEPARTMENT Provider Note   CSN: [id number] Arrival date & time: 07/14/19  3662     History Chief Complaint  Patient presents with  . Wrist Pain    Jason Acosta is a 38 y.o. male presents today for right wrist pain.  Patient reports pain ongoing for approximately 1 month.  He describes a throbbing sensation to the dorsum of his right wrist constant moderate intensity worsened with movement, nonradiating.  Slightly improved with oxycodone given during a prior visit.  He reports x-rays were performed at this facility of the right wrist during a previous visit and he reports they were "negative".  Unable to find these images and chart review.  Additionally patient requests recheck of his left great toe.  He reports he was seen at this ED last month for pain and swelling and was prescribed an antibiotic, symptoms improved but he is concerned that his toenail may be falling off.  No fever/chills, headache, chest pain, shortness of breath, abdominal pain, nausea vomiting, numbness/tingling, weakness, color change or any additional concerns.  HPI     History reviewed. No pertinent past medical history.  There are no problems to display for this patient.   History reviewed. No pertinent surgical history.     Family History  Problem Relation Age of Onset  . Diabetes Mother   . Healthy Father     Social History   Tobacco Use  . Smoking status: Current Every Day Smoker    Packs/day: 0.50    Types: Cigarettes, Cigars  . Smokeless tobacco: Never Used  . Tobacco comment: black and milds  Substance Use Topics  . Alcohol use: Never  . Drug use: Never    Home Medications Prior to Admission medications   Medication Sig Start Date End Date Taking? Authorizing Provider  naproxen (NAPROSYN) 375 MG tablet Take 1 tablet (375 mg total) by mouth 2 (two) times daily. 07/14/19   Bill Salinas, PA-C  oxyCODONE-acetaminophen (PERCOCET/ROXICET)  5-325 MG tablet Take 1 tablet by mouth every 8 (eight) hours as needed for severe pain. 06/22/19   McDonald, Mia A, PA-C  predniSONE (DELTASONE) 20 MG tablet Take 2 tablets daily with breakfast. 06/15/19   Wallis Bamberg, PA-C    Allergies    Patient has no known allergies.  Review of Systems   Review of Systems Ten systems are reviewed and are negative for acute change except as noted in the HPI  Physical Exam Updated Vital Signs BP 114/63 (BP Location: Left Arm)   Pulse 68   Temp 98.3 F (36.8 C) (Oral)   Resp 14   Ht 5\' 8"  (1.727 m)   Wt 63.5 kg   SpO2 98%   BMI 21.29 kg/m   Physical Exam Constitutional:      General: He is not in acute distress.    Appearance: Normal appearance. He is well-developed. He is not ill-appearing or diaphoretic.  HENT:     Head: Normocephalic and atraumatic.     Right Ear: External ear normal.     Left Ear: External ear normal.     Nose: Nose normal.  Eyes:     General: Vision grossly intact. Gaze aligned appropriately.     Pupils: Pupils are equal, round, and reactive to light.  Neck:     Trachea: Trachea and phonation normal. No tracheal deviation.  Cardiovascular:     Rate and Rhythm: Normal rate and regular rhythm.     Pulses:  Radial pulses are 2+ on the right side and 2+ on the left side.       Dorsalis pedis pulses are 2+ on the left side.  Pulmonary:     Effort: Pulmonary effort is normal. No respiratory distress.  Abdominal:     General: There is no distension.     Palpations: Abdomen is soft.     Tenderness: There is no abdominal tenderness. There is no guarding or rebound.  Musculoskeletal:        General: Normal range of motion.     Right elbow: Normal.     Right forearm: Normal.     Left forearm: Normal.     Right wrist: Tenderness present. No deformity. Normal range of motion.     Left wrist: Normal.     Right hand: Normal.     Left hand: Normal.       Arms:     Cervical back: Normal range of motion.      Left foot: Normal.       Feet:     Comments: Slight discoloration of the left great toenail, yellow in color.  No surrounding infection. - Right hand: No gross deformities, skin intact. Fingers appear normal.  Tenderness to palpation over dorsum of right wrist and snuffbox. No tenderness to palpation over flexor sheath.  Finger adduction/abduction intact with 5/5 strength.  Thumb opposition intact. Full active and resisted ROM to flexion/extension at wrist, MCP, PIP and DIP of all fingers increased pain with wrist extension and radial deviation.  FDS/FDP intact. Grip 5/5 strength.  Radial artery 2+ with <2sec cap refill in all fingers.  Sensation intact to light-tough in median/ulnar/radial distributions. Compartments soft.  Feet:     Left foot:     Protective Sensation: 5 sites tested. 5 sites sensed.  Skin:    General: Skin is warm and dry.     Capillary Refill: Capillary refill takes less than 2 seconds.  Neurological:     Mental Status: He is alert.     GCS: GCS eye subscore is 4. GCS verbal subscore is 5. GCS motor subscore is 6.     Comments: Speech is clear and goal oriented, follows commands Major Cranial nerves without deficit, no facial droop Moves extremities without ataxia, coordination intact  Psychiatric:        Behavior: Behavior normal.     ED Results / Procedures / Treatments   Labs (all labs ordered are listed, but only abnormal results are displayed) Labs Reviewed - No data to display  EKG None  Radiology No results found.  Procedures Procedures (including critical care time)  Medications Ordered in ED Medications - No data to display  ED Course  I have reviewed the triage vital signs and the nursing notes.  Pertinent labs & imaging results that were available during my care of the patient were reviewed by me and considered in my medical decision making (see chart for details).    MDM Rules/Calculators/A&P  Patient presents today for right wrist  pain ongoing for several weeks no injury.  Physical examination shows some tenderness of the dorsum of the right wrist as well as around the scaphoid, increased pain primarily with extension and radial deviation.  Capillary refill and sensation intact to all fingers, range of motion is intact with all movements, compartments soft to palpation, radial pulses intact and equal bilaterally, movements of the fingers, elbow and shoulder intact without pain.  There is no swelling or overlying skin changes.  No evidence of cellulitis, septic arthritis, DVT, compartment syndrome or other emergent pathologies.  I have been unable to find patient's previous x-rays and chart review today, I have offered patient x-ray imaging of his hand and wrist and explained to him in detail concern for a scaphoid injury.  He is fully alert and oriented and has the mental capacity to make his own medical decisions and he has refused x-ray imaging today.  I have ordered him a thumb spica splint and encouraged him to follow-up with orthopedics for further evaluation.  Patient is aware that he may return to the emergency department for further work-up and x-ray imaging at any time.  A detailed discussion was held with the patient of concern for delayed scaphoid injury and that he should wear the thumb spica splint until he is cleared by orthopedics.  RICE protocol discussed.  As to patient's left great toenail concern, it is slightly discolored there is no sign of a paronychia or surrounding infection, it is still attached and does not appear to be avulsed.  No surrounding skin changes.  It appears that previous infection has improved following doxycycline.  I have advised him that he may follow-up with podiatry or his primary care provider if this causes further concern.  There is no indication for imaging or further work-up in the ER.  He is neurovascular intact with good capillary refill and sensation to all toes, strong pedal pulses, full  range of motion with all movements of the lower extremities no evidence of gout, DVT, cellulitis, septic arthritis, compartment syndrome or other emergent pathologies.  At this time there does not appear to be any evidence of an acute emergency medical condition and the patient appears stable for discharge with appropriate outpatient follow up. Diagnosis was discussed with patient who verbalizes understanding of care plan and is agreeable to discharge. I have discussed return precautions with patient  who verbalizes understanding of return precautions. Patient encouraged to follow-up with their PCP within ortho. All questions answered.  Patient has been discharged in good condition.  Note: Portions of this report may have been transcribed using voice recognition software. Every effort was made to ensure accuracy; however, inadvertent computerized transcription errors may still be present. Final Clinical Impression(s) / ED Diagnoses Final diagnoses:  Acute pain of right wrist  Contusion of toenail of left foot, subsequent encounter    Rx / DC Orders ED Discharge Orders         Ordered    naproxen (NAPROSYN) 375 MG tablet  2 times daily     07/14/19 2151           Gari Crown 07/14/19 2151    Maudie Flakes, MD 07/15/19 [phone]

## 2019-07-14 NOTE — ED Triage Notes (Signed)
Pt reports being to the hospital about 3 times for his right wrist but nothing has been done. States the pain is worse in the morning but continues throughout the day.

## 2019-07-14 NOTE — Discharge Instructions (Addendum)
You have been diagnosed today with right wrist pain, left toenail discoloration.  At this time there does not appear to be the presence of an emergent medical condition, however there is always the potential for conditions to change. Please read and follow the below instructions.  Please return to the Emergency Department immediately for any new or worsening symptom. Please be sure to follow up with your Primary Care Provider within one week regarding your visit today; please call their office to schedule an appointment even if you are feeling better for a follow-up visit. As we discussed I advised that you have an x-ray performed of your wrists to evaluate for injury.  I am concerned that you could have an injury of your scaphoid bone in your hand.  You may return to the emergency department at any time for further evaluation.  Please wear the thumb spica splint given to you today to protect your wrist and scaphoid bone from further injury.  Please call the orthopedic specialist Dr. Griffin Basil on your discharge paperwork for further evaluation and treatment, call his office tomorrow to schedule an appointment.  He may use rest, ice and elevation to help with your symptoms. You have been prescribed an NSAID-containing medication called Naproxen today.  Do not take the medications including ibuprofen, Aleve, Advil or other NSAID-containing medications with Naproxen.  Please be sure to drink plenty of water. Please continue to monitor your toe for signs of infection.  You may follow-up with your primary care provider for recheck of your toe.  You have also been given referral to the podiatry clinic in [place] to schedule a follow-up appointment.  If you develop signs of infection return to the ER for further evaluation and treatment.  Get help right away if: You lose feeling in your fingers or hand. Your fingers turn white, very red, or cold and blue. You cannot move your fingers. You have a fever or  chills. Your foot is numb or tingling. Your foot or toes are swollen. Your foot or toes turn white or blue. You have warmth and redness along your foot. You have any new/concerning or worsening of symptoms  Please read the additional information packets attached to your discharge summary.  Do not take your medicine if  develop an itchy rash, swelling in your mouth or lips, or difficulty breathing; call 911 and seek immediate emergency medical attention if this occurs.  Note: Portions of this text may have been transcribed using voice recognition software. Every effort was made to ensure accuracy; however, inadvertent computerized transcription errors may still be present.

## 2019-08-17 ENCOUNTER — Other Ambulatory Visit: Payer: Self-pay

## 2019-08-17 ENCOUNTER — Emergency Department (HOSPITAL_COMMUNITY)
Admission: EM | Admit: 2019-08-17 | Discharge: 2019-08-18 | Disposition: A | Discharge status: Home / Self Care | Payer: Self-pay | Attending: Emergency Medicine | Admitting: Emergency Medicine

## 2019-08-17 ENCOUNTER — Encounter (HOSPITAL_COMMUNITY): Payer: Self-pay

## 2019-08-17 DIAGNOSIS — R002 Palpitations: Secondary | ICD-10-CM | POA: Insufficient documentation

## 2019-08-17 DIAGNOSIS — R0602 Shortness of breath: Secondary | ICD-10-CM | POA: Insufficient documentation

## 2019-08-17 DIAGNOSIS — Z5321 Procedure and treatment not carried out due to patient leaving prior to being seen by health care provider: Secondary | ICD-10-CM | POA: Insufficient documentation

## 2019-08-17 NOTE — ED Triage Notes (Addendum)
Patient states palpitations and SOB started approx 30 minutes ago.  During triage, Patient stated that he wanted to die and that he wanted to kill himself

## 2019-08-17 NOTE — ED Triage Notes (Signed)
Pt walked out of triage and when asked where he was going and if he was leaving he just looked at [place] with a blank stare until he cleared the doorway

## 2019-10-02 ENCOUNTER — Encounter (HOSPITAL_COMMUNITY): Payer: Self-pay | Admitting: Emergency Medicine

## 2019-10-02 ENCOUNTER — Other Ambulatory Visit: Payer: Self-pay

## 2019-10-02 ENCOUNTER — Emergency Department (HOSPITAL_COMMUNITY)
Admission: EM | Admit: 2019-10-02 | Discharge: 2019-10-03 | Disposition: A | Discharge status: Home / Self Care | Payer: Self-pay | Attending: Emergency Medicine | Admitting: Emergency Medicine

## 2019-10-02 DIAGNOSIS — R519 Headache, unspecified: Secondary | ICD-10-CM | POA: Insufficient documentation

## 2019-10-02 DIAGNOSIS — J3489 Other specified disorders of nose and nasal sinuses: Secondary | ICD-10-CM | POA: Insufficient documentation

## 2019-10-02 DIAGNOSIS — R0989 Other specified symptoms and signs involving the circulatory and respiratory systems: Secondary | ICD-10-CM

## 2019-10-02 DIAGNOSIS — Z20822 Contact with and (suspected) exposure to covid-19: Secondary | ICD-10-CM | POA: Insufficient documentation

## 2019-10-02 DIAGNOSIS — F17210 Nicotine dependence, cigarettes, uncomplicated: Secondary | ICD-10-CM | POA: Insufficient documentation

## 2019-10-02 NOTE — ED Triage Notes (Signed)
Patient reports headache this evening , denies head injury , no emesis or photophobia , no fever or chills .

## 2019-10-03 ENCOUNTER — Emergency Department (HOSPITAL_COMMUNITY): Payer: Self-pay

## 2019-10-03 LAB — SARS CORONAVIRUS 2 (TAT 6-24 HRS): SARS Coronavirus 2: NEGATIVE

## 2019-10-03 MED ORDER — ACETAMINOPHEN 500 MG PO TABS
1000.0000 mg | ORAL_TABLET | Freq: Once | ORAL | Status: AC
  Administered 2019-10-03: 05:00:00 1000 mg via ORAL
  Filled 2019-10-03: qty 2

## 2019-10-03 MED ORDER — METOCLOPRAMIDE HCL 10 MG PO TABS
10.0000 mg | ORAL_TABLET | Freq: Once | ORAL | Status: AC
  Administered 2019-10-03: 05:00:00 10 mg via ORAL
  Filled 2019-10-03: qty 1

## 2019-10-03 NOTE — ED Notes (Signed)
Mesner MD at bedside.  

## 2019-10-03 NOTE — ED Provider Notes (Addendum)
[facility] EMERGENCY DEPARTMENT Provider Note   CSN: [id number] Arrival date & time: 10/02/19  2306     History Chief Complaint  Patient presents with  . Headache    Jason Acosta is a 39 y.o. male.   Headache Pain location:  Generalized Quality:  Dull Radiates to:  Does not radiate Duration:  2 days Timing:  Constant Progression:  Waxing and waning Chronicity:  Recurrent Similar to prior headaches: yes   Context: not activity and not exposure to bright light   Relieved by:  None tried Worsened by:  Nothing      History reviewed. No pertinent past medical history.  There are no problems to display for this patient.   History reviewed. No pertinent surgical history.     Family History  Problem Relation Age of Onset  . Diabetes Mother   . Healthy Father     Social History   Tobacco Use  . Smoking status: Current Every Day Smoker    Packs/day: 0.50    Types: Cigarettes, Cigars  . Smokeless tobacco: Never Used  . Tobacco comment: black and milds  Substance Use Topics  . Alcohol use: Never  . Drug use: Never    Home Medications Prior to Admission medications   Not on File    Allergies    Patient has no known allergies.  Review of Systems   Review of Systems  Neurological: Positive for headaches.  All other systems reviewed and are negative.   Physical Exam Updated Vital Signs BP 120/68 (BP Location: Right Arm)   Pulse 68   Temp 98.2 F (36.8 C) (Oral)   Resp 18   Ht 5\' 9"  (1.753 m)   Wt 80 kg   SpO2 98%   BMI 26.05 kg/m   Physical Exam Vitals and nursing note reviewed.  Constitutional:      Appearance: He is well-developed.  HENT:     Head: Normocephalic and atraumatic.  Cardiovascular:     Rate and Rhythm: Normal rate.  Pulmonary:     Effort: Pulmonary effort is normal. No respiratory distress.  Abdominal:     General: There is no distension.  Musculoskeletal:        General: Normal range of motion.      Cervical back: Normal range of motion.  Skin:    General: Skin is warm and dry.  Neurological:     Mental Status: He is alert.     GCS: GCS eye subscore is 4. GCS verbal subscore is 5. GCS motor subscore is 6.     Cranial Nerves: No cranial nerve deficit.     Sensory: No sensory deficit.     Motor: No weakness.     Coordination: Coordination normal. Finger-Nose-Finger Test normal.     Gait: Gait normal.     Deep Tendon Reflexes: Reflexes are normal and symmetric.  Psychiatric:        Mood and Affect: Mood normal.     ED Results / Procedures / Treatments   Labs (all labs ordered are listed, but only abnormal results are displayed) Labs Reviewed  SARS CORONAVIRUS 2 (TAT 6-24 HRS)    EKG None  Radiology DG Chest Portable 1 View  Result Date: 10/03/2019 CLINICAL DATA:  Headache and cough. EXAM: PORTABLE CHEST 1 VIEW COMPARISON:  None. FINDINGS: The heart size and mediastinal contours are within normal limits. Both lungs are clear. The visualized skeletal structures are unremarkable. IMPRESSION: No active disease. Electronically Signed   By:  Marin Roberts M.D.   On: 10/03/2019 05:02    Procedures Procedures (including critical care time)  Medications Ordered in ED Medications  acetaminophen (TYLENOL) tablet 1,000 mg (1,000 mg Oral Given 10/03/19 0447)  metoCLOPramide (REGLAN) tablet 10 mg (10 mg Oral Given 10/03/19 0448)    ED Course  I have reviewed the triage vital signs and the nursing notes.  Pertinent labs & imaging results that were available during my care of the patient were reviewed by me and considered in my medical decision making (see chart for details).    MDM Rules/Calculators/A&P   Headache seems tension in nature. Wants covid check. Also with likely allergies as cause of runny nose, possibly allergic sinusitis. Pending covid test at time of discharge, headache and nausea improved.   Final Clinical Impression(s) / ED Diagnoses Final diagnoses:   Nonintractable headache, unspecified chronicity pattern, unspecified headache type  Runny nose    Rx / DC Orders ED Discharge Orders    None       Karaline Buresh, Barbara Cower, MD 10/03/19 [id number]    Marily Memos, MD 10/03/19 [phone]

## 2019-10-03 NOTE — ED Notes (Signed)
Pt was discharged from the ED. Pt read and understood discharge paperwork. Pt had vital signs completed. Pt conscious, breathing, and A&Ox4. No distress noted. Pt speaking in complete sentences. Pt ambulated out of the ED with a smooth and steady gait. E-signature not available.  

## 2019-10-03 NOTE — ED Notes (Signed)
Pt came to the ED per triage complaint. Pt conscious, breathing, and A&Ox4. Pt ambulated back to [facility] [address]. Pt endorses "I had a headache that started last evening.". Chest rise and fall equally with non-labored breathing. Lungs clear apex to base. Abd soft and non-tender. Pt denies chest pain, n/v/d, shortness of breath, and f/c. Bed in lowest position with call light within reach. Pt on continuous blood pressure, pulse ox, and cardiac monitor. Will continue to monitor. Awaiting MD eval. No distress noted.

## 2019-10-03 NOTE — ED Notes (Signed)
Pt resting in bed. Pt denies new or worsening complaints. Will continue to monitor. No distress noted. Pt on continuous monitoring via blood pressure, pulse ox, and cardiac monitor.  

## 2020-10-17 IMAGING — DX DG CHEST 1V PORT
1 series · 1 of 1 positions shown · non-contrast
Comparison: None.

CLINICAL DATA: Headache and cough.

EXAM:
PORTABLE CHEST 1 VIEW

[chest ap]
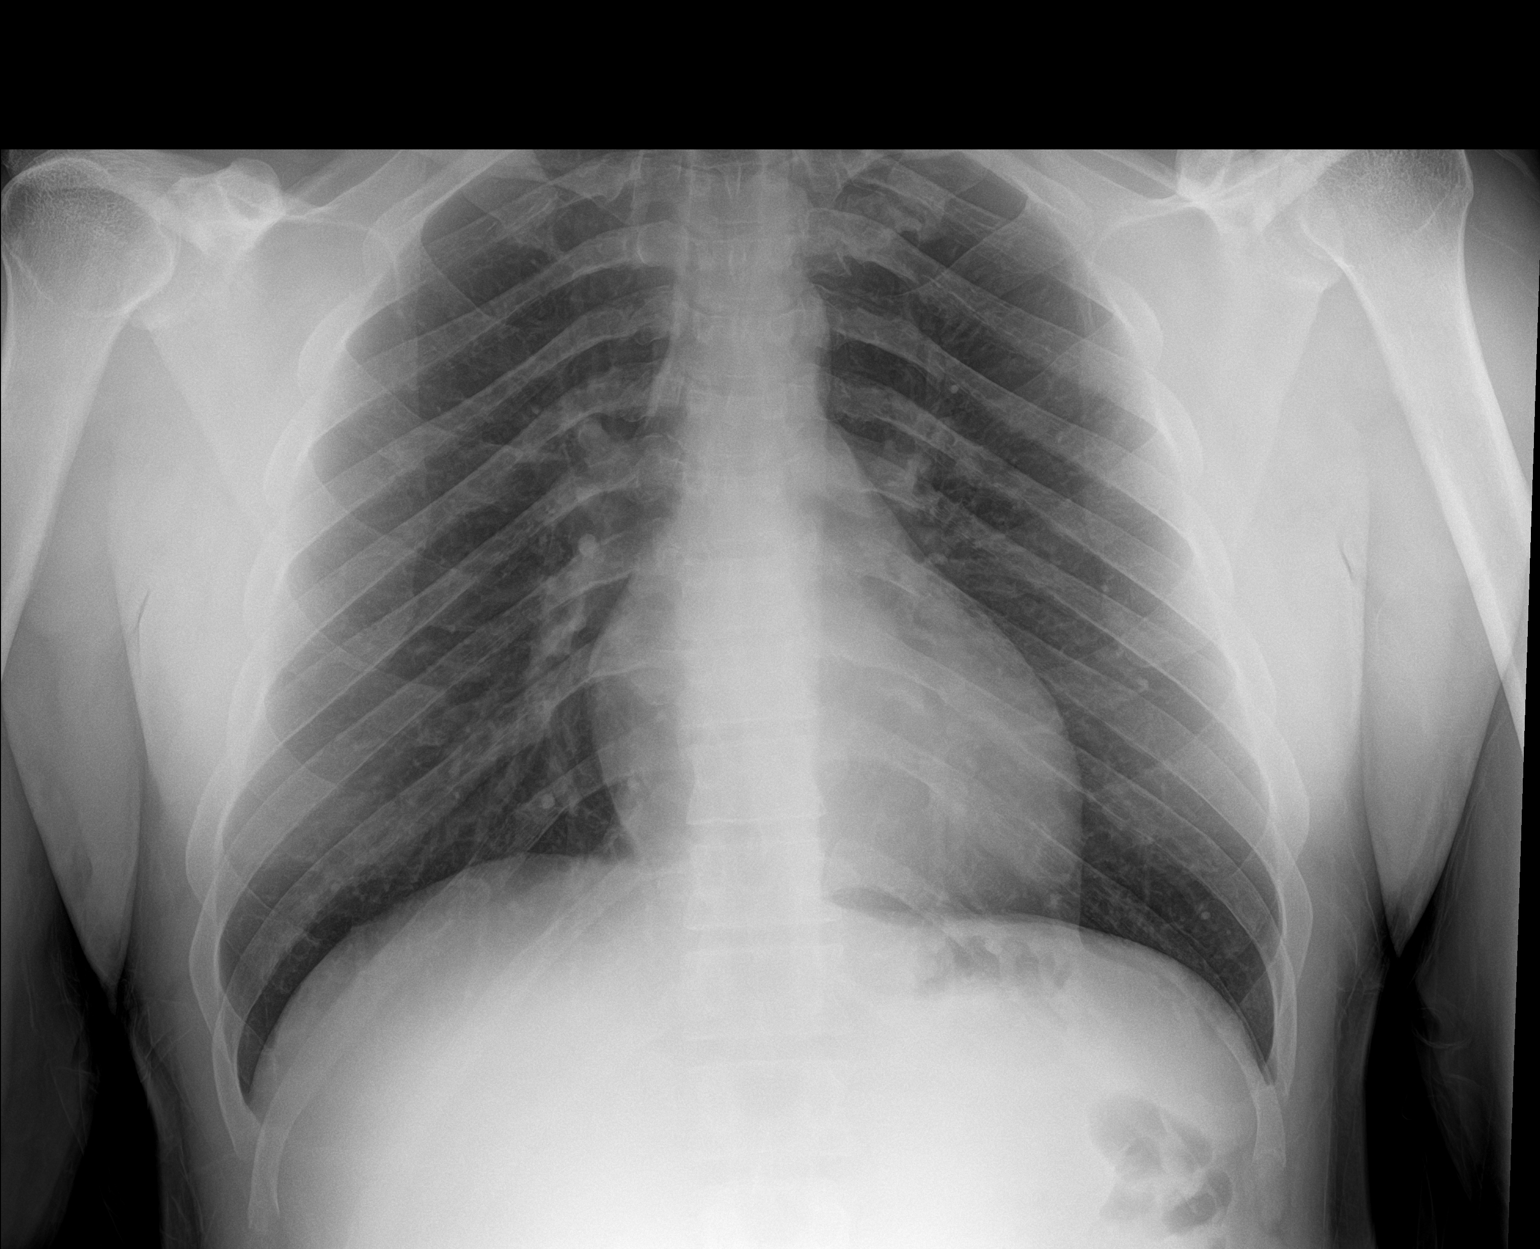

[1 of 1 positions shown; findings below may reference images not displayed]

FINDINGS: The heart size and mediastinal contours are within normal limits.
Both lungs are clear. The visualized skeletal structures are
unremarkable.
IMPRESSION: No active disease.
